# Patient Record
Sex: Female | Born: 1940 | Hispanic: No | Smoking: Never smoker
Health system: Southern US, Community
[De-identification: ages and names within clinical notes are randomized; demographics above are authoritative.]

## PROBLEM LIST (undated history)

## (undated) DIAGNOSIS — E079 Disorder of thyroid, unspecified: Secondary | ICD-10-CM

## (undated) DIAGNOSIS — K219 Gastro-esophageal reflux disease without esophagitis: Secondary | ICD-10-CM

## (undated) DIAGNOSIS — A048 Other specified bacterial intestinal infections: Secondary | ICD-10-CM

## (undated) DIAGNOSIS — E039 Hypothyroidism, unspecified: Secondary | ICD-10-CM

## (undated) HISTORY — PX: HEMORROIDECTOMY: SUR656

## (undated) HISTORY — DX: Gastro-esophageal reflux disease without esophagitis: K21.9

## (undated) HISTORY — PX: CHOLECYSTECTOMY: SHX55

## (undated) HISTORY — DX: Disorder of thyroid, unspecified: E07.9

## (undated) HISTORY — DX: Other specified bacterial intestinal infections: A04.8

---

## 2018-02-26 ENCOUNTER — Ambulatory Visit: Payer: Self-pay | Attending: Internal Medicine | Admitting: Internal Medicine

## 2018-02-26 ENCOUNTER — Encounter: Payer: Self-pay | Admitting: Internal Medicine

## 2018-02-26 VITALS — BP 152/82 | HR 69 | Temp 98.8°F | Resp 16 | Ht 60.5 in | Wt 147.0 lb

## 2018-02-26 DIAGNOSIS — R1013 Epigastric pain: Secondary | ICD-10-CM

## 2018-02-26 DIAGNOSIS — R030 Elevated blood-pressure reading, without diagnosis of hypertension: Secondary | ICD-10-CM

## 2018-02-26 DIAGNOSIS — K8050 Calculus of bile duct without cholangitis or cholecystitis without obstruction: Secondary | ICD-10-CM

## 2018-02-26 DIAGNOSIS — K8070 Calculus of gallbladder and bile duct without cholecystitis without obstruction: Secondary | ICD-10-CM | POA: Insufficient documentation

## 2018-02-26 DIAGNOSIS — E039 Hypothyroidism, unspecified: Secondary | ICD-10-CM

## 2018-02-26 DIAGNOSIS — Z0000 Encounter for general adult medical examination without abnormal findings: Secondary | ICD-10-CM | POA: Insufficient documentation

## 2018-02-26 DIAGNOSIS — D7589 Other specified diseases of blood and blood-forming organs: Secondary | ICD-10-CM

## 2018-02-26 MED ORDER — LEVOTHYROXINE SODIUM 75 MCG PO TABS: 75.0000 ug | ORAL_TABLET | Freq: Every day | ORAL | 3 refills | Status: AC

## 2018-02-26 MED FILL — LEVOTHYROXINE 75 MCG TABLET: 75 | 30 days supply | Qty: 30 | Fill #0

## 2018-02-26 NOTE — Progress Notes (Signed)
Patient ID: Diana Miller, female    DOB: January 14, 1941  MRN: [id number]  CC: No chief complaint on file.   Subjective: Diana Miller is a 77 y.o. female who presents for new pt visit.  Sons, Yaser and [place], are with her.  The son, Lazarus Gowda, interprets.  pt from [place].  Patient came to the [place] a little over a month ago to see her husband who was ailing and has subsequently died. Her concerns today include:   Pt with hx of hypothyroid, H.pylori gastritis and gall stones.  She is on Synthroid 75 mcg daily.  Before leaving [place] 2 to 3 months ago she was having problems with recurrent left and right upper quadrant abdominal pain with meals especially certain types of foods like fatty foods and cheese.  She was found to be H. pylori positive and diagnosed with biliary colic.  Ultrasound reportedly showed several gallstones in the gallbladder.  She was placed on therapy for H. pylori but only took it for 2 days before departing for the [place].  She has several medicines with her 1 of which is a PPI and the other is Cipro.  Today her main complaint is intermittent pain in RUQ of the abdomen with vomiting.  Pain occurs 1 to 2 hours after a meal.  Again it is worse with the types of food mentioned above.  She also gets pain in the epigastric area when she drinks Pepsi.  She is moving her bowels okay.  Denies any blood in the stools.  No problems swallowing.  She eats 2-3 meals a day.  Denies any significant weight changes.   No current outpatient medications on file prior to visit.   No current facility-administered medications on file prior to visit.     No Known Allergies  Social History   Socioeconomic History  . Marital status: Married    Spouse name: Not on file  . Number of children: Not on file  . Years of education: Not on file  . Highest education level: Not on file  Occupational History  . Not on file  Social Needs  . Financial resource strain: Not on file  . Food  insecurity:    Worry: Not on file    Inability: Not on file  . Transportation needs:    Medical: Not on file    Non-medical: Not on file  Tobacco Use  . Smoking status: Not on file  Substance and Sexual Activity  . Alcohol use: Not on file  . Drug use: Not on file  . Sexual activity: Not on file  Lifestyle  . Physical activity:    Days per week: Not on file    Minutes per session: Not on file  . Stress: Not on file  Relationships  . Social connections:    Talks on phone: Not on file    Gets together: Not on file    Attends religious service: Not on file    Active member of club or organization: Not on file    Attends meetings of clubs or organizations: Not on file    Relationship status: Not on file  . Intimate partner violence:    Fear of current or ex partner: Not on file    Emotionally abused: Not on file    Physically abused: Not on file    Forced sexual activity: Not on file  Other Topics Concern  . Not on file  Social History Narrative  . Not on file  ROS: Review of Systems As stated above. PHYSICAL EXAM: BP (!) 152/82   Pulse 69   Temp 98.8 F (37.1 C) (Oral)   Resp 16   Ht 5' 0.5" (1.537 m)   Wt 147 lb (66.7 kg)   SpO2 99%   BMI 28.24 kg/m   150/80 Physical Exam  General appearance - alert, well appearing, elderly female and in no distress Mental status -patient answers questions appropriately. Eyes - pupils equal and reactive, extraocular eye movements intact Mouth - mucous membranes moist, pharynx normal without lesions Neck - supple, no significant adenopathy Chest - clear to auscultation, no wheezes, rales or rhonchi, symmetric air entry Heart - normal rate, regular rhythm, normal S1, S2, no murmurs, rubs, clicks or gallops Abdomen -normal bowel sounds.  Soft, mild right upper quadrant tenderness.  Positive Murphy's sign.  No organomegaly. Extremities -no lower extremity edema.  ASSESSMENT AND PLAN: 1. Biliary colic -Advised patient to  apply for the orange card/cone discount card.  Once approved we can refer to a general surgeon.  Will get gallbladder ultrasound to confirm gallstones. - Comprehensive metabolic panel - US Abdomen Limited RUQ; Future  2. Dyspepsia Avoid NSAIDs.  GERD precautions given.  She can continue to take the PPI that she currently has - CBC - Comprehensive metabolic panel - H. pylori breath test  3. Acquired hypothyroidism - TSH - levothyroxine (SYNTHROID, LEVOTHROID) 75 MCG tablet; Take 1 tablet (75 mcg total) by mouth daily.  Dispense: 90 tablet; Refill: 3  4. Elevated blood pressure reading without diagnosis of hypertension No past history of elevated blood pressure.  DASH diet advised.  We will recheck blood pressure on follow-up visit.  Patient was given the opportunity to ask questions.  Patient verbalized understanding of the plan and was able to repeat key elements of the plan.   Orders Placed This Encounter  Procedures  . US Abdomen Limited RUQ  . CBC  . Comprehensive metabolic panel  . TSH  . H. pylori breath test     Requested Prescriptions   Signed Prescriptions Disp Refills  . levothyroxine (SYNTHROID, LEVOTHROID) 75 MCG tablet 90 tablet 3    Sig: Take 1 tablet (75 mcg total) by mouth daily.    Return in about 1 month (around 03/29/2018).  Jonah Blueeborah Garon Melander, MD, FACP

## 2018-02-27 LAB — CBC
HEMOGLOBIN: 12.5 g/dL (ref 11.1–15.9)
Hematocrit: 38 % (ref 34.0–46.6)
MCH: 32.8 pg (ref 26.6–33.0)
MCHC: 32.9 g/dL (ref 31.5–35.7)
MCV: 100 fL — AB (ref 79–97)
Platelets: 226 10*3/uL (ref 150–450)
RBC: 3.81 x10E6/uL (ref 3.77–5.28)
RDW: 14.1 % (ref 12.3–15.4)
WBC: 4.4 10*3/uL (ref 3.4–10.8)

## 2018-02-27 LAB — COMPREHENSIVE METABOLIC PANEL
A/G RATIO: 1.4 (ref 1.2–2.2)
ALBUMIN: 4.2 g/dL (ref 3.5–4.8)
ALT: 11 IU/L (ref 0–32)
AST: 18 IU/L (ref 0–40)
Alkaline Phosphatase: 78 IU/L (ref 39–117)
BUN / CREAT RATIO: 17 (ref 12–28)
BUN: 11 mg/dL (ref 8–27)
Bilirubin Total: 0.3 mg/dL (ref 0.0–1.2)
CALCIUM: 9.2 mg/dL (ref 8.7–10.3)
CO2: 24 mmol/L (ref 20–29)
Chloride: 102 mmol/L (ref 96–106)
Creatinine, Ser: 0.64 mg/dL (ref 0.57–1.00)
GFR, EST AFRICAN AMERICAN: 99 mL/min/{1.73_m2} (ref >59)
GFR, EST NON AFRICAN AMERICAN: 86 mL/min/{1.73_m2} (ref >59)
Globulin, Total: 3 g/dL (ref 1.5–4.5)
Glucose: 124 mg/dL — ABNORMAL HIGH (ref 65–99)
POTASSIUM: 4 mmol/L (ref 3.5–5.2)
Sodium: 141 mmol/L (ref 134–144)
TOTAL PROTEIN: 7.2 g/dL (ref 6.0–8.5)

## 2018-02-27 LAB — TSH: TSH: 0.875 u[IU]/mL (ref 0.450–4.500)

## 2018-02-27 NOTE — Progress Notes (Signed)
Addendum:  Pt with mild macrocytosis. Will add Vit B 12 and folate level.

## 2018-02-27 NOTE — Addendum Note (Signed)
Addended by: Jonah BlueJOHNSON, DEBORAH B on: 02/27/2018 11:54 AM   Modules accepted: Orders

## 2018-02-28 LAB — H. PYLORI BREATH TEST: H pylori Breath Test: POSITIVE — AB

## 2018-03-01 LAB — SPECIMEN STATUS REPORT

## 2018-03-01 LAB — VITAMIN B12: VITAMIN B 12: 437 pg/mL (ref 232–1245)

## 2018-03-01 LAB — FOLATE

## 2018-03-02 ENCOUNTER — Other Ambulatory Visit: Payer: Self-pay | Admitting: Internal Medicine

## 2018-03-02 MED ORDER — OMEPRAZOLE 20 MG PO CPDR: 20.0000 mg | DELAYED_RELEASE_CAPSULE | Freq: Two times a day (BID) | ORAL | 0 refills | Status: DC

## 2018-03-02 MED ORDER — AMOXICILLIN 500 MG PO TABS: 1000.0000 mg | ORAL_TABLET | Freq: Two times a day (BID) | ORAL | 0 refills | Status: DC

## 2018-03-02 MED ORDER — CLARITHROMYCIN 500 MG PO TABS: 500.0000 mg | ORAL_TABLET | Freq: Two times a day (BID) | ORAL | 0 refills | Status: DC

## 2018-03-04 ENCOUNTER — Telehealth: Payer: Self-pay

## 2018-03-04 ENCOUNTER — Ambulatory Visit: Payer: Self-pay | Attending: Internal Medicine

## 2018-03-04 MED FILL — OMEPRAZOLE 20 MG CAP: 20 | 10 days supply | Qty: 20 | Fill #0

## 2018-03-04 MED FILL — CLARITHROMYCIN 500 MG TAB: 500 | 10 days supply | Qty: 20 | Fill #0

## 2018-03-04 MED FILL — AMOXICILLIN 500 MG CAPSULE: 500 | 10 days supply | Qty: 40 | Fill #0

## 2018-03-04 NOTE — Telephone Encounter (Signed)
[employer] Shireen Id# [phone]  contacted pt to go over lab results contacted pt to go over lab results pt was not home spoke with pt son who came to the appointment with her Lazarus GowdaYaser went over results and pt son doesn't have any questions or concerns

## 2018-03-06 ENCOUNTER — Ambulatory Visit: Payer: Self-pay

## 2018-03-12 ENCOUNTER — Ambulatory Visit (HOSPITAL_COMMUNITY)
Admission: RE | Admit: 2018-03-12 | Discharge: 2018-03-12 | Disposition: A | Discharge status: Home / Self Care | Payer: Self-pay | Source: Ambulatory Visit | Attending: Internal Medicine | Admitting: Internal Medicine

## 2018-03-12 DIAGNOSIS — R1011 Right upper quadrant pain: Secondary | ICD-10-CM | POA: Insufficient documentation

## 2018-03-12 DIAGNOSIS — K8050 Calculus of bile duct without cholangitis or cholecystitis without obstruction: Secondary | ICD-10-CM

## 2018-03-12 DIAGNOSIS — K8020 Calculus of gallbladder without cholecystitis without obstruction: Secondary | ICD-10-CM | POA: Insufficient documentation

## 2018-03-13 ENCOUNTER — Telehealth: Payer: Self-pay

## 2018-03-13 NOTE — Telephone Encounter (Signed)
Call placed to the patient with the assistance of Arabic interpreter # [phone] with [facility] [employer].   As per Dr Laural BenesJohnson:  Let son know that US confirms multiple gall stones. Let me know once she is approved for OC so that we can refer to surgeon.   This CM spoke to son, Lazarus GowdaYaser, and informed him of above note.  He stated that he has applied for [employer] Assistance, so that is in progress. He said that regarding the [employer] Card, he was instructed to bring his mother back to the clinic to apply because she was not with him when he came to make the initial application. He said that he will let us know when she receives the [employer] Card. He also explained that his mother is planning to leave the country to see her mother [place] who is quite ill. He did not have a time frame of when she would be leaving. Informed him that this information would be shared with DrJohnson and the Artistinancial Counselor.

## 2018-03-15 ENCOUNTER — Telehealth: Payer: Self-pay

## 2018-03-15 DIAGNOSIS — K8050 Calculus of bile duct without cholangitis or cholecystitis without obstruction: Secondary | ICD-10-CM

## 2018-03-15 NOTE — Telephone Encounter (Signed)
Patient stopped by to inform pcp that they have received the cafa letter for referral. Please fu regarding surgery at your earliest convenience.

## 2018-03-21 ENCOUNTER — Encounter: Payer: Self-pay | Admitting: Surgery

## 2018-03-21 ENCOUNTER — Telehealth: Payer: Self-pay | Admitting: Internal Medicine

## 2018-03-21 ENCOUNTER — Ambulatory Visit (INDEPENDENT_AMBULATORY_CARE_PROVIDER_SITE_OTHER): Payer: Self-pay | Admitting: Surgery

## 2018-03-21 VITALS — BP 142/85 | HR 76 | Temp 98.0°F | Ht 60.5 in | Wt 145.0 lb

## 2018-03-21 DIAGNOSIS — K8050 Calculus of bile duct without cholangitis or cholecystitis without obstruction: Secondary | ICD-10-CM

## 2018-03-21 NOTE — Progress Notes (Signed)
Diana Miller is an 77 y.o. female.   Chief Complaint: Merri RayGall stones HPI: This patient with gallstones referred for biliary colic.  The referring doctor is Dr. Laural BenesJohnson  She speaks Arabic and is from the [place].  No interpreter was available but her son is present and tells the entire story and history.  Complete review of systems not able to be completed due to language.  No past medical history on file.  Past medical history includes H. pylori and thyroid disease. Past surgical history includes C-sections  She has recently moved from the [place].   Family History  Problem Relation Age of Onset  . Diabetes Mother   . Hypertension Mother    Social History:  has no tobacco, alcohol, and drug history on file.  Allergies: No Known Allergies   (Not in a hospital admission)   Review of Systems:   Review of Systems  Unable to perform ROS: Language  Constitutional: Negative.   HENT: Negative.   Eyes: Negative.   Respiratory: Negative.   Cardiovascular: Negative.   Gastrointestinal: Positive for abdominal pain, nausea and vomiting. Negative for constipation and diarrhea.  Skin: Negative.     Physical Exam:  Physical Exam  Constitutional: She is oriented to person, place, and time. She appears well-developed and well-nourished. No distress.  HENT:  Head: Normocephalic and atraumatic.  Eyes: Pupils are equal, round, and reactive to light. EOM are normal. Right eye exhibits no discharge. Left eye exhibits no discharge. No scleral icterus.  Neck: Normal range of motion. Neck supple.  Cardiovascular: Normal rate, regular rhythm and normal heart sounds.  Pulmonary/Chest: Effort normal and breath sounds normal. No stridor. No respiratory distress.  Abdominal: Soft.  Musculoskeletal: Normal range of motion. She exhibits no edema or deformity.  Neurological: She is alert and oriented to person, place, and time.  Skin: Skin is warm and dry. No rash noted. She is not diaphoretic.  No erythema. No pallor.  Psychiatric: She has a normal mood and affect. Judgment normal.  Vitals reviewed.   There were no vitals taken for this visit.    No results found for this or any previous visit (from the past 48 hour(s)). No results found.   Assessment/Plan Patient has gallstones on ultrasound with normal liver function tests.  This patient with fairly classic story for biliary colic.  No sign of acute cholecystitis.  Recommend laparoscopic cholecystectomy.  Her function tests are normal therefore cholangiography is not indicated.  Risk of bleeding infection recurrence of symptoms failure to resolve her symptoms and conversion to an open procedure with the potential for bile duct injury or bowel injury were all reviewed with them they understood and agreed to proceed.  Lattie Hawichard E Malori Myers, MD, FACS

## 2018-03-21 NOTE — Patient Instructions (Addendum)
You have requested to have your Gallbladder removed. We will arrange this to be done on  03/26/2018 at [facility].   You will be off from work for approximately 1-2 weeks depending on your recovery.   Please avoid greasy and fried foods if at all possible prior to your scheduled surgery to decrease symptoms until then.  Please see the [facility] pre-care form you have been given today.  If you have any questions or concerns please call our office.     Laparoscopic Cholecystectomy, Care After This sheet gives you information about how to care for yourself after your procedure. Your doctor may also give you more specific instructions. If you have problems or questions, contact your doctor. Follow these instructions at home: Care for cuts from surgery (incisions)   Follow instructions from your doctor about how to take care of your cuts from surgery. Make sure you: ? Wash your hands with soap and water before you change your bandage (dressing). If you cannot use soap and water, use hand sanitizer. ? Change your bandage as told by your doctor. ? Leave stitches (sutures), skin glue, or skin tape (adhesive) strips in place. They may need to stay in place for 2 weeks or longer. If tape strips get loose and curl up, you may trim the loose edges. Do not remove tape strips completely unless your doctor says it is okay.  Do not take baths, swim, or use a hot tub until your doctor says it is okay. Ask your doctor if you can take showers. You may only be allowed to take sponge baths for bathing.  Check your surgical cut area every day for signs of infection. Check for: ? More redness, swelling, or pain. ? More fluid or blood. ? Warmth. ? Pus or a bad smell. Activity  Do not drive or use heavy machinery while taking prescription pain medicine.  Do not lift anything that is heavier than 10 lb (4.5 kg) until your doctor says it is okay.  Do not play contact sports until your doctor says it is  okay.  Do not drive for 24 hours if you were given a medicine to help you relax (sedative).  Rest as needed. Do not return to work or school until your doctor says it is okay. General instructions  Take over-the-counter and prescription medicines only as told by your doctor.  To prevent or treat constipation while you are taking prescription pain medicine, your doctor may recommend that you: ? Drink enough fluid to keep your pee (urine) clear or pale yellow. ? Take over-the-counter or prescription medicines. ? Eat foods that are high in fiber, such as fresh fruits and vegetables, whole grains, and beans. ? Limit foods that are high in fat and processed sugars, such as fried and sweet foods. Contact a doctor if:  You develop a rash.  You have more redness, swelling, or pain around your surgical cuts.  You have more fluid or blood coming from your surgical cuts.  Your surgical cuts feel warm to the touch.  You have pus or a bad smell coming from your surgical cuts.  You have a fever.  One or more of your surgical cuts breaks open. Get help right away if:  You have trouble breathing.  You have chest pain.  You have pain that is getting worse in your shoulders.  You faint or feel dizzy when you stand.  You have very bad pain in your belly (abdomen).  You are sick to  your stomach (nauseous) for more than one day.  You have throwing up (vomiting) that lasts for more than one day.  You have leg pain. This information is not intended to replace advice given to you by your health care provider. Make sure you discuss any questions you have with your health care provider. Document Released: 05/09/2008 Document Revised: 02/19/2016 Document Reviewed: 01/17/2016 Elsevier Interactive Patient Education  2018 [employer].

## 2018-03-21 NOTE — Telephone Encounter (Signed)
Patient son came to the facility stating that patient will be leaving the country and would like a six month supply for levothyroxine (SYNTHROID, LEVOTHROID) 75 MCG tablet Pt. Uses [facility] pharmacy. Please f/u

## 2018-03-25 ENCOUNTER — Telehealth: Payer: Self-pay | Admitting: Surgery

## 2018-03-25 NOTE — Telephone Encounter (Signed)
Pt advised of pre op date/time and sx date. Sx: 03/27/18 with Dr Ludwig Clarksooper-laparoscopic cholecystectomy.  Pre op: 03/26/18 @ 12:15pm--office interview. Interpreter requested.   Patient made aware to call [phone], between 1-3:00pm the day before surgery, to find out what time to arrive.

## 2018-03-26 ENCOUNTER — Other Ambulatory Visit: Payer: Self-pay

## 2018-03-26 ENCOUNTER — Encounter
Admission: RE | Admit: 2018-03-26 | Discharge: 2018-03-26 | Disposition: A | Discharge status: Home / Self Care | Payer: Self-pay | Source: Ambulatory Visit | Attending: Surgery | Admitting: Surgery

## 2018-03-26 DIAGNOSIS — Z01818 Encounter for other preprocedural examination: Secondary | ICD-10-CM | POA: Insufficient documentation

## 2018-03-26 DIAGNOSIS — Z01812 Encounter for preprocedural laboratory examination: Secondary | ICD-10-CM | POA: Insufficient documentation

## 2018-03-26 DIAGNOSIS — I491 Atrial premature depolarization: Secondary | ICD-10-CM | POA: Insufficient documentation

## 2018-03-26 HISTORY — DX: Hypothyroidism, unspecified: E03.9

## 2018-03-26 LAB — COMPREHENSIVE METABOLIC PANEL
ALT: 14 U/L (ref 0–44)
AST: 24 U/L (ref 15–41)
Albumin: 4.3 g/dL (ref 3.5–5.0)
Alkaline Phosphatase: 76 U/L (ref 38–126)
Anion gap: 8 (ref 5–15)
BUN: 13 mg/dL (ref 8–23)
CHLORIDE: 106 mmol/L (ref 98–111)
CO2: 27 mmol/L (ref 22–32)
Calcium: 9.4 mg/dL (ref 8.9–10.3)
Creatinine, Ser: 0.63 mg/dL (ref 0.44–1.00)
GFR calc non Af Amer: >60 mL/min (ref >60)
Glucose, Bld: 97 mg/dL (ref 70–99)
Potassium: 3.9 mmol/L (ref 3.5–5.1)
SODIUM: 141 mmol/L (ref 135–145)
Total Bilirubin: 0.8 mg/dL (ref 0.3–1.2)
Total Protein: 7.6 g/dL (ref 6.5–8.1)

## 2018-03-26 LAB — CBC WITH DIFFERENTIAL/PLATELET
BASOS ABS: 0 10*3/uL (ref 0–0.1)
Basophils Relative: 1 %
EOS ABS: 0.1 10*3/uL (ref 0–0.7)
Eosinophils Relative: 3 %
HCT: 36.9 % (ref 35.0–47.0)
Hemoglobin: 12.8 g/dL (ref 12.0–16.0)
Lymphocytes Relative: 48 %
Lymphs Abs: 2.2 10*3/uL (ref 1.0–3.6)
MCH: 33.9 pg (ref 26.0–34.0)
MCHC: 34.7 g/dL (ref 32.0–36.0)
MCV: 97.7 fL (ref 80.0–100.0)
Monocytes Absolute: 0.6 10*3/uL (ref 0.2–0.9)
Monocytes Relative: 13 %
Neutro Abs: 1.5 10*3/uL (ref 1.4–6.5)
Neutrophils Relative %: 35 %
PLATELETS: 226 10*3/uL (ref 150–440)
RBC: 3.77 MIL/uL — AB (ref 3.80–5.20)
RDW: 14.2 % (ref 11.5–14.5)
WBC: 4.5 10*3/uL (ref 3.6–11.0)

## 2018-03-26 MED ORDER — CEFAZOLIN SODIUM-DEXTROSE 2-4 GM/100ML-% IV SOLN
2.0000 g | INTRAVENOUS | Status: AC
  Administered 2018-03-27: 2 g via INTRAVENOUS

## 2018-03-26 MED FILL — LEVOTHYROXINE 75 MCG TABLET: 75 | 180 days supply | Qty: 180 | Fill #1

## 2018-03-26 NOTE — Patient Instructions (Addendum)
Your procedure is scheduled on: Tomorrow at 8:45 Report to DAY SURGERY DEPARTMENT LOCATED ON 2ND FLOOR MEDICAL MALL ENTRANCE.  [phone]   Remember: Instructions that are not followed completely may result in serious medical risk, up to and including death, or upon the discretion of your surgeon and anesthesiologist your surgery may need to be rescheduled.     _X__ 1. Do not eat food after midnight the night before your procedure.                 No gum chewing or hard candies. You may drink clear liquids up to 2 hours                 before you are scheduled to arrive for your surgery- DO not drink clear                 liquids within 2 hours of the start of your surgery.                 Clear Liquids include:  water, apple juice without pulp, clear carbohydrate                 drink such as Clearfast or Gatorade, Black Coffee or Tea (Do not add                 anything to coffee or tea).  __X__2.  On the morning of surgery brush your teeth with toothpaste and water, you                 may rinse your mouth with mouthwash if you wish.  Do not swallow any              toothpaste of mouthwash.     _X__ 3.  No Alcohol for 24 hours before or after surgery.   _X__ 4.  Do Not Smoke or use e-cigarettes For 24 Hours Prior to Your Surgery.                 Do not use any chewable tobacco products for at least 6 hours prior to                 surgery.  ____  5.  Bring all medications with you on the day of surgery if instructed.   __X__  6.  Notify your doctor if there is any change in your medical condition      (cold, fever, infections).     Do not wear jewelry, make-up, hairpins, clips or nail polish. Do not wear lotions, powders, or perfumes.  Do not shave 48 hours prior to surgery. Men may shave face and neck. Do not bring valuables to the hospital.    [facility] is not responsible for any belongings or valuables.  Contacts, dentures/partials or body piercings may not be worn into  surgery. Bring a case for your contacts, glasses or hearing aids, a denture cup will be supplied. Leave your suitcase in the car. After surgery it may be brought to your room. For patients admitted to the hospital, discharge time is determined by your treatment team.   Patients discharged the day of surgery will not be allowed to drive home.   Please read over the following fact sheets that you were given:   MRSA Information  __X__ Take these medicines the morning of surgery with A SIP OF WATER:    1. levothyroxine (SYNTHROID, LEVOTHROID) 75 MCG tablet  2.   3.  4.  5.  6.  ____ Fleet Enema (as directed)   __X__ Use CHG Soap/SAGE wipes as directed  ____ Use inhalers on the day of surgery  ____ Stop metformin/Janumet/Farxiga 2 days prior to surgery    ____ Take 1/2 of usual insulin dose the night before surgery. No insulin the morning          of surgery.   ____ Stop Blood Thinners Coumadin/Plavix/Xarelto/Pleta/Pradaxa/Eliquis/Effient/Aspirin  on   Or contact your Surgeon, Cardiologist or Medical Doctor regarding  ability to stop your blood thinners  __X__ Stop Anti-inflammatories 7 days before surgery such as Advil, Ibuprofen, Motrin,  BC or Goodies Powder, Naprosyn, Naproxen, Aleve, Aspirin    __X__ Stop all herbal supplements, fish oil or vitamin E until after surgery.    ____ Bring C-Pap to the hospital.

## 2018-03-27 ENCOUNTER — Ambulatory Visit: Payer: Self-pay | Admitting: Anesthesiology

## 2018-03-27 ENCOUNTER — Encounter: Payer: Self-pay | Admitting: *Deleted

## 2018-03-27 ENCOUNTER — Other Ambulatory Visit: Payer: Self-pay

## 2018-03-27 ENCOUNTER — Inpatient Hospital Stay
Admission: RE | Admit: 2018-03-27 | Discharge: 2018-03-29 | DRG: 418 | Disposition: A | Discharge status: Home / Self Care | Payer: Self-pay | Source: Ambulatory Visit | Attending: Surgery | Admitting: Surgery

## 2018-03-27 ENCOUNTER — Encounter
Admission: RE | Disposition: A | Discharge status: Home / Self Care | Payer: Self-pay | Source: Ambulatory Visit | Attending: Surgery

## 2018-03-27 DIAGNOSIS — Y658 Other specified misadventures during surgical and medical care: Secondary | ICD-10-CM | POA: Diagnosis not present

## 2018-03-27 DIAGNOSIS — K5900 Constipation, unspecified: Secondary | ICD-10-CM | POA: Diagnosis present

## 2018-03-27 DIAGNOSIS — K9171 Accidental puncture and laceration of a digestive system organ or structure during a digestive system procedure: Secondary | ICD-10-CM | POA: Diagnosis not present

## 2018-03-27 DIAGNOSIS — K8044 Calculus of bile duct with chronic cholecystitis without obstruction: Principal | ICD-10-CM | POA: Diagnosis present

## 2018-03-27 DIAGNOSIS — X58XXXA Exposure to other specified factors, initial encounter: Secondary | ICD-10-CM | POA: Diagnosis present

## 2018-03-27 DIAGNOSIS — Z4659 Encounter for fitting and adjustment of other gastrointestinal appliance and device: Secondary | ICD-10-CM

## 2018-03-27 DIAGNOSIS — K660 Peritoneal adhesions (postprocedural) (postinfection): Secondary | ICD-10-CM | POA: Diagnosis present

## 2018-03-27 DIAGNOSIS — K8050 Calculus of bile duct without cholangitis or cholecystitis without obstruction: Secondary | ICD-10-CM

## 2018-03-27 HISTORY — PX: CHOLECYSTECTOMY: SHX55

## 2018-03-27 SURGERY — LAPAROSCOPIC CHOLECYSTECTOMY
Anesthesia: General | Wound class: Clean Contaminated

## 2018-03-27 MED ORDER — LIDOCAINE HCL (PF) 2 % IJ SOLN
INTRAMUSCULAR | Status: AC
Start: 2018-03-27 — End: ?
  Filled 2018-03-27: qty 10

## 2018-03-27 MED ORDER — FENTANYL CITRATE (PF) 100 MCG/2ML IJ SOLN
INTRAMUSCULAR | Status: AC
  Administered 2018-03-27: 25 ug via INTRAVENOUS
  Filled 2018-03-27: qty 2

## 2018-03-27 MED ORDER — PROPOFOL 10 MG/ML IV BOLUS
INTRAVENOUS | Status: AC
  Filled 2018-03-27: qty 20

## 2018-03-27 MED ORDER — ONDANSETRON HCL 4 MG/2ML IJ SOLN
INTRAMUSCULAR | Status: DC | PRN
  Administered 2018-03-27: 4 mg via INTRAVENOUS

## 2018-03-27 MED ORDER — FENTANYL CITRATE (PF) 100 MCG/2ML IJ SOLN
25.0000 ug | INTRAMUSCULAR | Status: DC | PRN
  Administered 2018-03-27 (×3): 25 ug via INTRAVENOUS

## 2018-03-27 MED ORDER — ONDANSETRON HCL 4 MG/2ML IJ SOLN: 4.0000 mg | Freq: Four times a day (QID) | INTRAMUSCULAR | Status: DC | PRN

## 2018-03-27 MED ORDER — MEPERIDINE HCL 50 MG/ML IJ SOLN: 6.2500 mg | INTRAMUSCULAR | Status: DC | PRN

## 2018-03-27 MED ORDER — FENTANYL CITRATE (PF) 100 MCG/2ML IJ SOLN
INTRAMUSCULAR | Status: DC | PRN
  Administered 2018-03-27: 25 ug via INTRAVENOUS
  Administered 2018-03-27: 75 ug via INTRAVENOUS
  Administered 2018-03-27: 50 ug via INTRAVENOUS

## 2018-03-27 MED ORDER — FENTANYL CITRATE (PF) 100 MCG/2ML IJ SOLN
INTRAMUSCULAR | Status: AC
  Filled 2018-03-27: qty 2

## 2018-03-27 MED ORDER — SUGAMMADEX SODIUM 200 MG/2ML IV SOLN
INTRAVENOUS | Status: DC | PRN
  Administered 2018-03-27: 135 mg via INTRAVENOUS

## 2018-03-27 MED ORDER — LACTATED RINGERS IV SOLN
INTRAVENOUS | Status: DC
  Administered 2018-03-27: 10:00:00 via INTRAVENOUS

## 2018-03-27 MED ORDER — HYDROMORPHONE HCL 1 MG/ML IJ SOLN
0.5000 mg | INTRAMUSCULAR | Status: DC | PRN
  Administered 2018-03-27 – 2018-03-28 (×3): 0.5 mg via INTRAVENOUS
  Filled 2018-03-27 (×3): qty 0.5

## 2018-03-27 MED ORDER — LIDOCAINE HCL (CARDIAC) PF 100 MG/5ML IV SOSY
PREFILLED_SYRINGE | INTRAVENOUS | Status: DC | PRN
  Administered 2018-03-27: 80 mg via INTRAVENOUS

## 2018-03-27 MED ORDER — CEFAZOLIN SODIUM-DEXTROSE 1-4 GM/50ML-% IV SOLN
1.0000 g | Freq: Four times a day (QID) | INTRAVENOUS | Status: DC
  Administered 2018-03-27 – 2018-03-28 (×3): 1 g via INTRAVENOUS
  Filled 2018-03-27 (×6): qty 50

## 2018-03-27 MED ORDER — BUPIVACAINE-EPINEPHRINE (PF) 0.25% -1:200000 IJ SOLN
INTRAMUSCULAR | Status: AC
  Filled 2018-03-27: qty 30

## 2018-03-27 MED ORDER — BUPIVACAINE-EPINEPHRINE (PF) 0.25% -1:200000 IJ SOLN
INTRAMUSCULAR | Status: DC | PRN
  Administered 2018-03-27: 30 mL

## 2018-03-27 MED ORDER — OXYCODONE HCL 5 MG PO TABS: 5.0000 mg | ORAL_TABLET | Freq: Once | ORAL | Status: DC | PRN

## 2018-03-27 MED ORDER — PROPOFOL 10 MG/ML IV BOLUS
INTRAVENOUS | Status: DC | PRN
  Administered 2018-03-27: 100 mg via INTRAVENOUS

## 2018-03-27 MED ORDER — OXYCODONE HCL 5 MG/5ML PO SOLN: 5.0000 mg | Freq: Once | ORAL | Status: DC | PRN

## 2018-03-27 MED ORDER — HEPARIN SODIUM (PORCINE) 5000 UNIT/ML IJ SOLN
5000.0000 [IU] | Freq: Three times a day (TID) | INTRAMUSCULAR | Status: DC
  Administered 2018-03-27 – 2018-03-29 (×6): 5000 [IU] via SUBCUTANEOUS
  Filled 2018-03-27 (×6): qty 1

## 2018-03-27 MED ORDER — ONDANSETRON HCL 4 MG PO TABS: 4.0000 mg | ORAL_TABLET | Freq: Four times a day (QID) | ORAL | Status: DC | PRN

## 2018-03-27 MED ORDER — SUGAMMADEX SODIUM 200 MG/2ML IV SOLN
INTRAVENOUS | Status: AC
  Filled 2018-03-27: qty 2

## 2018-03-27 MED ORDER — FAMOTIDINE IN NACL 20-0.9 MG/50ML-% IV SOLN
20.0000 mg | Freq: Two times a day (BID) | INTRAVENOUS | Status: DC
  Administered 2018-03-27 – 2018-03-29 (×5): 20 mg via INTRAVENOUS
  Filled 2018-03-27 (×6): qty 50

## 2018-03-27 MED ORDER — ACETAMINOPHEN 10 MG/ML IV SOLN
INTRAVENOUS | Status: AC
  Filled 2018-03-27: qty 100

## 2018-03-27 MED ORDER — HEPARIN SODIUM (PORCINE) 5000 UNIT/ML IJ SOLN
5000.0000 [IU] | Freq: Once | INTRAMUSCULAR | Status: AC
  Administered 2018-03-27: 5000 [IU] via SUBCUTANEOUS

## 2018-03-27 MED ORDER — CHLORHEXIDINE GLUCONATE CLOTH 2 % EX PADS
6.0000 | MEDICATED_PAD | Freq: Once | CUTANEOUS | Status: AC
  Administered 2018-03-27: 6 via TOPICAL

## 2018-03-27 MED ORDER — PROMETHAZINE HCL 25 MG/ML IJ SOLN: 6.2500 mg | INTRAMUSCULAR | Status: DC | PRN

## 2018-03-27 MED ORDER — ROCURONIUM BROMIDE 100 MG/10ML IV SOLN
INTRAVENOUS | Status: DC | PRN
  Administered 2018-03-27: 35 mg via INTRAVENOUS

## 2018-03-27 MED ORDER — ACETAMINOPHEN 10 MG/ML IV SOLN
INTRAVENOUS | Status: DC | PRN
  Administered 2018-03-27: 1000 mg via INTRAVENOUS

## 2018-03-27 MED ORDER — ROCURONIUM BROMIDE 50 MG/5ML IV SOLN
INTRAVENOUS | Status: AC
  Filled 2018-03-27: qty 1

## 2018-03-27 MED ORDER — KCL IN DEXTROSE-NACL 10-5-0.45 MEQ/L-%-% IV SOLN
INTRAVENOUS | Status: DC
  Administered 2018-03-27 – 2018-03-28 (×3): via INTRAVENOUS
  Filled 2018-03-27 (×6): qty 1000

## 2018-03-27 MED ORDER — ONDANSETRON HCL 4 MG/2ML IJ SOLN
INTRAMUSCULAR | Status: AC
  Filled 2018-03-27: qty 2

## 2018-03-27 MED ORDER — CEFAZOLIN SODIUM-DEXTROSE 2-4 GM/100ML-% IV SOLN
INTRAVENOUS | Status: AC
  Filled 2018-03-27: qty 100

## 2018-03-27 MED ORDER — PHENYLEPHRINE HCL 10 MG/ML IJ SOLN
INTRAMUSCULAR | Status: DC | PRN
  Administered 2018-03-27: 50 ug via INTRAVENOUS

## 2018-03-27 MED ORDER — HEPARIN SODIUM (PORCINE) 5000 UNIT/ML IJ SOLN
INTRAMUSCULAR | Status: AC
  Administered 2018-03-27: 5000 [IU] via SUBCUTANEOUS
  Filled 2018-03-27: qty 1

## 2018-03-27 SURGICAL SUPPLY — 46 items
ADHESIVE MASTISOL STRL (MISCELLANEOUS) ×3 IMPLANT
APPLIER CLIP ROT 10 11.4 M/L (STAPLE) ×3
BLADE SURG SZ11 CARB STEEL (BLADE) ×3 IMPLANT
CANISTER SUCT 1200ML W/VALVE (MISCELLANEOUS) ×3 IMPLANT
CATH CHOLANGI 4FR 420404F (CATHETERS) IMPLANT
CHLORAPREP W/TINT 26ML (MISCELLANEOUS) ×3 IMPLANT
CLIP APPLIE ROT 10 11.4 M/L (STAPLE) ×1 IMPLANT
CLOSURE WOUND 1/2 X4 (GAUZE/BANDAGES/DRESSINGS) ×1
CONRAY 60ML FOR OR (MISCELLANEOUS) IMPLANT
DRAPE C-ARM XRAY 36X54 (DRAPES) IMPLANT
ELECT REM PT RETURN 9FT ADLT (ELECTROSURGICAL) ×3
ELECTRODE REM PT RTRN 9FT ADLT (ELECTROSURGICAL) ×1 IMPLANT
GAUZE SPONGE 4X4 12PLY STRL (GAUZE/BANDAGES/DRESSINGS) ×3 IMPLANT
GLOVE BIO SURGEON STRL SZ8 (GLOVE) ×3 IMPLANT
GOWN STRL REUS W/ TWL LRG LVL3 (GOWN DISPOSABLE) ×4 IMPLANT
GOWN STRL REUS W/TWL LRG LVL3 (GOWN DISPOSABLE) ×8
IRRIGATION STRYKERFLOW (MISCELLANEOUS) ×1 IMPLANT
IRRIGATOR STRYKERFLOW (MISCELLANEOUS) ×3
IV CATH ANGIO 12GX3 LT BLUE (NEEDLE) IMPLANT
IV NS 1000ML (IV SOLUTION) ×2
IV NS 1000ML BAXH (IV SOLUTION) ×1 IMPLANT
JACKSON PRATT 10 (INSTRUMENTS) IMPLANT
KIT TURNOVER KIT A (KITS) ×3 IMPLANT
LABEL OR SOLS (LABEL) ×3 IMPLANT
NEEDLE HYPO 22GX1.5 SAFETY (NEEDLE) ×3 IMPLANT
NEEDLE VERESS 14GA 120MM (NEEDLE) ×3 IMPLANT
NS IRRIG 500ML POUR BTL (IV SOLUTION) ×3 IMPLANT
PACK LAP CHOLECYSTECTOMY (MISCELLANEOUS) ×3 IMPLANT
POUCH SPECIMEN RETRIEVAL 10MM (ENDOMECHANICALS) ×3 IMPLANT
SCISSORS METZENBAUM CVD 33 (INSTRUMENTS) ×3 IMPLANT
SLEEVE ENDOPATH XCEL 5M (ENDOMECHANICALS) ×6 IMPLANT
SPONGE GAUZE 2X2 8PLY STER LF (GAUZE/BANDAGES/DRESSINGS) ×4
SPONGE GAUZE 2X2 8PLY STRL LF (GAUZE/BANDAGES/DRESSINGS) ×8 IMPLANT
SPONGE LAP 18X18 RF (DISPOSABLE) ×3 IMPLANT
SPONGE VERSALON 4X4 4PLY (MISCELLANEOUS) IMPLANT
STRIP CLOSURE SKIN 1/2X4 (GAUZE/BANDAGES/DRESSINGS) ×2 IMPLANT
SUT MNCRL 4-0 (SUTURE) ×2
SUT MNCRL 4-0 27XMFL (SUTURE) ×1
SUT SILK 3-0 (SUTURE) ×9 IMPLANT
SUT VIC AB 0 CT1 36 (SUTURE) ×9 IMPLANT
SUT VICRYL 0 AB UR-6 (SUTURE) ×3 IMPLANT
SUTURE MNCRL 4-0 27XMF (SUTURE) ×1 IMPLANT
SYR 20CC LL (SYRINGE) ×3 IMPLANT
TROCAR XCEL NON-BLD 11X100MML (ENDOMECHANICALS) ×3 IMPLANT
TROCAR XCEL NON-BLD 5MMX100MML (ENDOMECHANICALS) ×3 IMPLANT
TUBING INSUFFLATION (TUBING) ×3 IMPLANT

## 2018-03-27 NOTE — Anesthesia Postprocedure Evaluation (Signed)
Anesthesia Post Note  Patient: Agricultural consultantZainab Mukhtar Baca  Procedure(s) Performed: LAPAROSCOPIC CHOLECYSTECTOMY (N/A )  Patient location during evaluation: PACU Anesthesia Type: General Level of consciousness: awake and alert and oriented Pain management: pain level controlled Vital Signs Assessment: post-procedure vital signs reviewed and stable Respiratory status: spontaneous breathing, nonlabored ventilation and respiratory function stable Cardiovascular status: blood pressure returned to baseline and stable Postop Assessment: no signs of nausea or vomiting Anesthetic complications: no     Last Vitals:  Vitals:   03/27/18 1246 03/27/18 1251  BP:    Pulse: 79 79  Resp: 16 19  Temp:    SpO2: 99% 96%    Last Pain:  Vitals:   03/27/18 1251  TempSrc:   PainSc: 3                  Carvell Hoeffner

## 2018-03-27 NOTE — Anesthesia Procedure Notes (Signed)
Procedure Name: Intubation Performed by: Meshell Abdulaziz, CRNA Pre-anesthesia Checklist: Patient identified, Patient being monitored, Timeout performed, Emergency Drugs available and Suction available Patient Re-evaluated:Patient Re-evaluated prior to induction Oxygen Delivery Method: Circle system utilized Preoxygenation: Pre-oxygenation with 100% oxygen Induction Type: IV induction Ventilation: Mask ventilation without difficulty Laryngoscope Size: Mac and 3 Grade View: Grade I Tube type: Oral Tube size: 7.0 mm Number of attempts: 1 Airway Equipment and Method: Stylet Placement Confirmation: ETT inserted through vocal cords under direct vision,  positive ETCO2 and breath sounds checked- equal and bilateral Secured at: 21 cm Tube secured with: Tape Dental Injury: Teeth and Oropharynx as per pre-operative assessment        

## 2018-03-27 NOTE — Anesthesia Preprocedure Evaluation (Signed)
Anesthesia Evaluation  Patient identified by MRN, date of birth, ID band Patient awake    Reviewed: Allergy & Precautions, NPO status , Patient's Chart, lab work & pertinent test results  History of Anesthesia Complications Negative for: history of anesthetic complications  Airway Mallampati: II  TM Distance: >3 FB Neck ROM: Full    Dental  (+) Poor Dentition, Missing   Pulmonary neg pulmonary ROS, neg sleep apnea, neg COPD,    breath sounds clear to auscultation- rhonchi (-) wheezing      Cardiovascular Exercise Tolerance: Good (-) hypertension(-) CAD, (-) Past MI, (-) Cardiac Stents and (-) CABG  Rhythm:Regular Rate:Normal - Systolic murmurs and - Diastolic murmurs    Neuro/Psych negative neurological ROS  negative psych ROS   GI/Hepatic Neg liver ROS, GERD  Medicated and Controlled,  Endo/Other  neg diabetesHypothyroidism   Renal/GU      Musculoskeletal negative musculoskeletal ROS (+)   Abdominal (+) - obese,   Peds  Hematology negative hematology ROS (+)   Anesthesia Other Findings Past Medical History: No date: GERD (gastroesophageal reflux disease) No date: H. pylori infection No date: Hypothyroidism No date: Thyroid disease   Reproductive/Obstetrics                             Anesthesia Physical Anesthesia Plan  ASA: II  Anesthesia Plan: General   Post-op Pain Management:    Induction: Intravenous  PONV Risk Score and Plan: 2 and Ondansetron, Dexamethasone and Midazolam  Airway Management Planned: Oral ETT  Additional Equipment:   Intra-op Plan:   Post-operative Plan: Extubation in OR  Informed Consent: I have reviewed the patients History and Physical, chart, labs and discussed the procedure including the risks, benefits and alternatives for the proposed anesthesia with the patient or authorized representative who has indicated his/her understanding and  acceptance.   Dental advisory given  Plan Discussed with: CRNA and Anesthesiologist  Anesthesia Plan Comments:         Anesthesia Quick Evaluation

## 2018-03-27 NOTE — Op Note (Signed)
Laparoscopic Cholecystectomy  Pre-operative Diagnosis: Biliary colic  Post-operative Diagnosis: Biliary colic, extensive adhesions  Procedure: Laparoscopic cholecystectomy, gastrorrhaphy  Surgeon: Adah Salvageichard E. Excell Seltzerooper, MD FACS  Anesthesia: Gen. with endotracheal tube  Assistant: PA student  Procedure Details  The patient was seen again in the Holding Room. The benefits, complications, treatment options, and expected outcomes were discussed with the patient. The risks of bleeding, infection, recurrence of symptoms, failure to resolve symptoms, bile duct damage, bile duct leak, retained common bile duct stone, bowel injury, any of which could require further surgery and/or ERCP, stent, or papillotomy were reviewed with the patient. The likelihood of improving the patient's symptoms with return to their baseline status is good.  The patient and/or family concurred with the proposed plan, giving informed consent.  The patient was taken to Operating Room, [facility] and the procedure verified as Laparoscopic Cholecystectomy.  A Time Out was held and the above information confirmed.  Prior to the induction of general anesthesia, antibiotic prophylaxis was administered. VTE prophylaxis was in place. General endotracheal anesthesia was then administered and tolerated well. After the induction, the abdomen was prepped with Chloraprep and draped in the sterile fashion. The patient was positioned in the supine position.  Local anesthetic  was injected into the skin near the umbilicus and an incision made. The Veress needle was placed. Pneumoperitoneum was then created with CO2 and tolerated well without any adverse changes in the patient's vital signs. A 5mm Visiport was placed in the periumbilical position and the first structure visualized was the mucosa of what was possibly the stomach.  With these findings the laparoscopic approach was abandoned temporarily.  A small incision was  made on the midline to open and explore the area.  Adhesions were taken down in this area sharply.  The trocar sheath had been left in the stomach and no leakage was encountered.  Through the small incision with the trocar sheath in place figure-of-eight 3 oh silks were placed and the trocar port was removed from the stomach as these were tied.  Again there was no spillage.  Lembert type sutures were then placed over the top of the primary layer to perform a watertight seal.  No leakage was encountered.  A nasogastric tube was then placed by anesthesia.  The 3 cm incision was then closed with figure-of-eight 0 Vicryls around a 5 mm blunt trocar.  The last suture was not tied until the end of the case.  Pneumoperitoneum was obtained through this port site.    Two 5-mm ports were placed in the right upper quadrant and a 12 mm epigastric port was placed all under direct vision. All skin incisions  were infiltrated with a local anesthetic agent before making the incision and placing the trocars.   The camera was placed in all 3 of the other port sites to view back and see that there were extensive adhesions in the infraumbilical area and slightly in the supraumbilical area most of these have been taken down.  There is no sign of other bowel involvement.  The patient was positioned  in reverse Trendelenburg, tilted slightly to the patient's left.  The gallbladder was identified, the fundus grasped and retracted cephalad. Adhesions were lysed bluntly. The infundibulum was grasped and retracted laterally, exposing the peritoneum overlying the triangle of Calot. This was then divided and exposed in a blunt fashion. A critical view of the cystic duct and cystic artery was obtained.  The cystic duct was clearly identified  and bluntly dissected.   The cystic lymphatics were doubly clipped and divided.  The cystic artery was then doubly clipped and divided.  This allowed for good visualization of the cystic duct as it  entered the infundibulum of the gallbladder.  Here it was doubly clipped and divided as well.  The gallbladder was taken from the gallbladder fossa in a retrograde fashion with the electrocautery. The gallbladder was removed and placed in an Endocatch bag. The liver bed was irrigated and inspected. Hemostasis was achieved with the electrocautery. Copious irrigation was utilized and was repeatedly aspirated until clear.  The gallbladder and Endocatch sac were then removed through the epigastric port site.   Inspection of the right upper quadrant was performed. No bleeding, bile duct injury or leak, or bowel injury was noted.  Inspection of the area of the greater curvature of the stomach was performed laparoscopically.  The sutures were intact and there was no sign of leakage.  Of note the repair of the injury was now found in the left upper quadrant on the greater curve suggesting that the stomach had been greatly dilated down to the umbilicus and held in place by the adhesions that had been lysed.  The 0 Vicryl final suture was tied and the umbilical port was removed.  Pneumoperitoneum was released.  The epigastric port site was closed with figure-of-eight 0 Vicryl sutures. 4-0 subcuticular Monocryl was used to close the skin. Steristrips and Mastisol and sterile dressings were  applied.  The patient was then extubated and brought to the recovery room in stable condition. Sponge, lap, and needle counts were correct at closure and at the conclusion of the case.   Findings: Chronic cholecystitis with adhesions  Estimated Blood Loss: Nil         Drains: None         Specimens: Gallbladder           Complications: 5 mm puncture of the greater curvature of the stomach repaired primarily in 2 layers with no spillage. Patient to be admitted for observation with nasogastric tube in place.  Sponge lap needle count was correct.               Zoraida Havrilla E. Excell Seltzerooper, MD, FACS

## 2018-03-27 NOTE — Transfer of Care (Signed)
Immediate Anesthesia Transfer of Care Note  Patient: Diana Miller  Procedure(s) Performed: LAPAROSCOPIC CHOLECYSTECTOMY (N/A )  Patient Location: PACU  Anesthesia Type:General  Level of Consciousness: awake and responds to stimulation  Airway & Oxygen Therapy: Patient Spontanous Breathing and Patient connected to face mask oxygen  Post-op Assessment: Report given to RN and Post -op Vital signs reviewed and stable  Post vital signs: Reviewed and stable  Last Vitals:  Vitals Value Taken Time  BP 153/67 03/27/2018 11:47 AM  Temp    Pulse 95 03/27/2018 11:47 AM  Resp 21 03/27/2018 11:47 AM  SpO2 100 % 03/27/2018 11:47 AM    Last Pain:  Vitals:   03/27/18 0910  TempSrc: Tympanic  PainSc: 0-No pain         Complications: No apparent anesthesia complications

## 2018-03-27 NOTE — Progress Notes (Signed)
Multiple family members are present.  Discussed again though he goals and options.  Questions about risks were minimal and answered completely.  Proceeding the operating room shortly.

## 2018-03-27 NOTE — Progress Notes (Signed)
Preoperative Review   Via interpretor:  Patient is met in the preoperative holding area. The history is reviewed in the chart and with the patient. I personally reviewed the options and rationale as well as the risks of this procedure that have been previously discussed with the patient. All questions asked by the patient and/or family were answered to their satisfaction.  Patient agrees to proceed with this procedure at this time.  Florene Glen M.D. FACS

## 2018-03-27 NOTE — Anesthesia Post-op Follow-up Note (Signed)
Anesthesia QCDR form completed.        

## 2018-03-28 ENCOUNTER — Inpatient Hospital Stay: Payer: Self-pay

## 2018-03-28 LAB — CBC
HEMATOCRIT: 33.9 % — AB (ref 35.0–47.0)
HEMOGLOBIN: 12.1 g/dL (ref 12.0–16.0)
MCH: 34.5 pg — AB (ref 26.0–34.0)
MCHC: 35.8 g/dL (ref 32.0–36.0)
MCV: 96.4 fL (ref 80.0–100.0)
PLATELETS: 211 10*3/uL (ref 150–440)
RBC: 3.51 MIL/uL — AB (ref 3.80–5.20)
RDW: 13.6 % (ref 11.5–14.5)
WBC: 7.4 10*3/uL (ref 3.6–11.0)

## 2018-03-28 LAB — COMPREHENSIVE METABOLIC PANEL
ALT: 28 U/L (ref 0–44)
AST: 45 U/L — AB (ref 15–41)
Albumin: 3.6 g/dL (ref 3.5–5.0)
Alkaline Phosphatase: 68 U/L (ref 38–126)
Anion gap: 6 (ref 5–15)
BUN: 7 mg/dL — AB (ref 8–23)
CALCIUM: 8.4 mg/dL — AB (ref 8.9–10.3)
CHLORIDE: 103 mmol/L (ref 98–111)
CO2: 26 mmol/L (ref 22–32)
CREATININE: 0.59 mg/dL (ref 0.44–1.00)
Glucose, Bld: 131 mg/dL — ABNORMAL HIGH (ref 70–99)
Potassium: 4 mmol/L (ref 3.5–5.1)
Sodium: 135 mmol/L (ref 135–145)
Total Bilirubin: 0.7 mg/dL (ref 0.3–1.2)
Total Protein: 7 g/dL (ref 6.5–8.1)

## 2018-03-28 LAB — SURGICAL PATHOLOGY

## 2018-03-28 MED ORDER — ACETAMINOPHEN 325 MG PO TABS: 650.0000 mg | ORAL_TABLET | Freq: Four times a day (QID) | ORAL | Status: DC | PRN

## 2018-03-28 MED ORDER — LEVOTHYROXINE SODIUM 75 MCG PO TABS
75.0000 ug | ORAL_TABLET | Freq: Every day | ORAL | Status: DC
  Administered 2018-03-29: 75 ug via ORAL
  Filled 2018-03-28 (×2): qty 1

## 2018-03-28 MED ORDER — IBUPROFEN 400 MG PO TABS
600.0000 mg | ORAL_TABLET | Freq: Four times a day (QID) | ORAL | Status: DC | PRN
Start: 2018-03-28 — End: 2018-03-29

## 2018-03-28 NOTE — Progress Notes (Signed)
During shift change, previous RN noticed that patient had not had an xray after NG tube placement and she immediately ordered a stat one view. Impression showed that the NG tube was incorrectly placed and [facility] Karle [facility]Lukens [facility] suggested pulling it out. Patient and son were informed of the need to remove the NG tube and need to reinsert another one. Patient refused to have another NG tube placed after explaining the importance of it. Dr.Cintron was notified of her decision. Patient also vomited small amount of emesis prior to discontinuing the NG tube. Abdomen soft and non-distended. Bowel sounds hypoactive in all four quads. Currently denies pain and nausea. Will continue to monitor patient for any further deviations.

## 2018-03-28 NOTE — Progress Notes (Signed)
1 Day Post-Op  Subjective: Status post laparoscopy and laparoscopic cholecystectomy with intraoperative gastrorrhaphy for gastric perforation.  Is a gastric tube was removed last night without an order from me but was not replaced due to patient's refusal.  Today she has a low-grade temp but a normal white blood cell count and normal LFTs.  Patient has no pain this morning.  Family members present for interpretation.  She does not want to have the nasogastric tube replaced. No nausea or vomiting. Objective: Vital signs in last 24 hours: Temp:  [97.6 F (36.4 C)-100 F (37.8 C)] 100 F (37.8 C) (08/15 0413) Pulse Rate:  [79-100] 96 (08/15 0413) Resp:  [13-22] 16 (08/15 0413) BP: (122-166)/(52-79) 166/76 (08/15 0413) SpO2:  [95 %-100 %] 96 % (08/15 0413)    Intake/Output from previous day: 08/14 0701 - 08/15 0700 In: 835 [I.V.:735; IV Piggyback:100] Out: 1075 [Urine:1075] Intake/Output this shift: Total I/O In: 1500 [I.V.:1500] Out: -   Physical exam:  Low-grade temp vital signs are stable Awake alert Abdomen is soft nondistended nontympanitic and nontender.  Wounds are dressed.  Dressings are dry. Calves are nontender.  Lab Results: CBC  Recent Labs    03/26/18 1330 03/28/18 0559  WBC 4.5 7.4  HGB 12.8 12.1  HCT 36.9 33.9*  PLT 226 211   BMET Recent Labs    03/26/18 1330 03/28/18 0559  NA 141 135  K 3.9 4.0  CL 106 103  CO2 27 26  GLUCOSE 97 131*  BUN 13 7*  CREATININE 0.63 0.59  CALCIUM 9.4 8.4*   PT/INR No results for input(s): LABPROT, INR in the last 72 hours. ABG No results for input(s): PHART, HCO3 in the last 72 hours.  Invalid input(s): PCO2, PO2  Studies/Results: Dg Abd Portable 1v  Result Date: 03/28/2018 CLINICAL DATA:  Check nasogastric catheter placement EXAM: PORTABLE ABDOMEN - 1 VIEW COMPARISON:  None. FINDINGS: Scattered large and small bowel gas is noted. Nasogastric catheter is noted with the tip in the mid esophagus and coiled  upon itself within the stomach. This should be withdrawn and reinserted. IMPRESSION: Nasogastric catheter coiled upon itself within the stomach with the tip in the mid esophagus at the level of the aortic knob. Electronically Signed   By: Alcide CleverMark  Lukens M.D.   On: 03/28/2018 00:26    Anti-infectives: Anti-infectives (From admission, onward)   Start     Dose/Rate Route Frequency Ordered Stop   03/27/18 1700  ceFAZolin (ANCEF) IVPB 1 g/50 mL premix     1 g 100 mL/hr over 30 Minutes Intravenous Every 6 hours 03/27/18 1320     03/27/18 0839  ceFAZolin (ANCEF) 2-4 GM/100ML-% IVPB    Note to Pharmacy:  Agnes Lawrenceobinson, Patricia  : cabinet override      03/27/18 0839 03/27/18 1050   03/27/18 0600  ceFAZolin (ANCEF) IVPB 2g/100 mL premix     2 g 200 mL/hr over 30 Minutes Intravenous On call to O.R. 03/26/18 2129 03/27/18 1120      Assessment/Plan: s/p Procedure(s): LAPAROSCOPIC CHOLECYSTECTOMY   Labs reviewed. We will leave the nasogastric tube out at the patient's request and I will start some clear liquids.  The repair of this 5 mm puncture was performed in 2 layers with embrocating sutures and she should be able to drink some liquids without difficulty.  Probably be able to discharge tomorrow.'s were answered for the family.  She has no pain this morning and is doing well.  Lattie Hawichard E Nolan Lasser, MD, FACS  03/28/2018

## 2018-03-29 MED ORDER — ONDANSETRON HCL 4 MG PO TABS: 4.0000 mg | ORAL_TABLET | Freq: Four times a day (QID) | ORAL | 0 refills | Status: DC | PRN

## 2018-03-29 MED ORDER — DOCUSATE SODIUM 100 MG PO CAPS
100.0000 mg | ORAL_CAPSULE | Freq: Two times a day (BID) | ORAL | Status: DC
  Administered 2018-03-29: 100 mg via ORAL
  Filled 2018-03-29: qty 1

## 2018-03-29 MED ORDER — BISACODYL 10 MG RE SUPP
10.0000 mg | Freq: Once | RECTAL | Status: AC
  Administered 2018-03-29: 10 mg via RECTAL
  Filled 2018-03-29: qty 1

## 2018-03-29 MED ORDER — LEVOTHYROXINE SODIUM 75 MCG PO TABS: 75.0000 ug | ORAL_TABLET | Freq: Every day | ORAL | 0 refills | Status: DC

## 2018-03-29 MED ORDER — BISACODYL 10 MG RE SUPP: 10.0000 mg | Freq: Once | RECTAL | 0 refills | Status: AC

## 2018-03-29 MED ORDER — HYDROCODONE-ACETAMINOPHEN 5-300 MG PO TABS: 1.0000 | ORAL_TABLET | ORAL | 0 refills | Status: DC | PRN

## 2018-03-29 MED ORDER — DOCUSATE SODIUM 100 MG PO CAPS
100.0000 mg | ORAL_CAPSULE | Freq: Two times a day (BID) | ORAL | 0 refills | Status: DC
Start: 2018-03-29 — End: 2018-04-01

## 2018-03-29 MED FILL — ONDANSETRON HCL 4 MG TABLET: 4 | 5 days supply | Qty: 20 | Fill #0

## 2018-03-29 NOTE — Care Management (Signed)
Patient to discharge today home with family.  Patient is listed as self pay.  PCP Laural BenesJOHNSON, DEBORAH B.  Follow up appointments have been made.  Patient to be provided rx for pain medication.  Zofran has been escribed to [facility].

## 2018-03-29 NOTE — Discharge Summary (Signed)
Physician Discharge Summary  Patient ID: Diana MurrayZainab Mukhtar Miller MRN: [id number] DOB/AGE: 79-Mar-1942 79 y.o.  Admit date: 03/27/2018 Discharge date: 03/29/2018   Discharge Diagnoses:  Active Problems:   Biliary colic   Procedures: Laparoscopic cholecystectomy, gastrorrhaphy for gastric puncture  Hospital Course: This patient who was admitted to the hospital following a laparoscopic cholecystectomy.  During that cholecystectomy adhesions had been encountered which resulted in a 5 mm puncture wound to the stomach which was repaired primarily.  In the postoperative period the patient's NG tube was removed and the patient refused to allow it to be replaced.  However she is tolerating a clear liquid diet at this point without difficulty.  I will advance her to full liquids today and asked them to stay on full liquids over the weekend.  I will see them in the office on Monday or Tuesday.  Instructions currently concerning low-grade fever and constipation have been given to the family as well.  Oral analgesics will be prescribed and she will restart all of her home medications including the Synthroid.  She will be given Colace and Dulcolax suppositories as well.  Questions were answered for her and her family member present at the bedside acting as an interpreter.  Consults: None  Disposition: Discharge disposition: 01-Home or Self Care        Allergies as of 03/29/2018      Reactions   Prilosec [omeprazole] Other (See Comments)   weakness      Medication List    TAKE these medications   bisacodyl 10 MG suppository Commonly known as:  DULCOLAX Place 1 suppository (10 mg total) rectally once for 1 dose.   docusate sodium 100 MG capsule Commonly known as:  COLACE Take 1 capsule (100 mg total) by mouth 2 (two) times daily.   HYDROcodone-Acetaminophen 5-300 MG Tabs Take 1 tablet by mouth every 4 (four) hours as needed.   levothyroxine 75 MCG tablet Commonly known as:  SYNTHROID,  LEVOTHROID Take 1 tablet (75 mcg total) by mouth daily. What changed:  Another medication with the same name was added. Make sure you understand how and when to take each.   levothyroxine 75 MCG tablet Commonly known as:  SYNTHROID, LEVOTHROID Take 1 tablet (75 mcg total) by mouth daily at 6 (six) AM. Start taking on:  03/30/2018 What changed:  You were already taking a medication with the same name, and this prescription was added. Make sure you understand how and when to take each.   MULTIVITAMIN PO Take 1 tablet by mouth daily.   omeprazole 20 MG capsule Commonly known as:  PRILOSEC Take 1 capsule (20 mg total) by mouth 2 (two) times daily before a meal.   ondansetron 4 MG tablet Commonly known as:  ZOFRAN Take 1 tablet (4 mg total) by mouth every 6 (six) hours as needed for nausea.      Follow-up Information    Lattie Hawooper, Labella Zahradnik E, MD Follow up in 4 day(s).   Specialty:  General Surgery Contact information: [address] [place] [address] [phone]           Lattie Hawichard E Laxmi Choung, MD, FACS

## 2018-03-29 NOTE — Progress Notes (Signed)
2 Days Post-Op  Subjective: Patient is postop day 2 from a laparoscopic cholecystectomy with gastrorrhaphy for stomach puncture.  Is tolerating clear liquid diet and has no complaints.  Did have a low-grade temperature last night.  That is cleared now and afebrile.  No nausea or vomiting.  Family members present with interpretation.  He does state that before surgery she was having trouble with constipation and would like something for that.  Objective: Vital signs in last 24 hours: Temp:  [98.7 F (37.1 C)-100 F (37.8 C)] 98.7 F (37.1 C) (08/16 0427) Pulse Rate:  [90-97] 91 (08/16 0427) Resp:  [17-20] 18 (08/16 0427) BP: (128-147)/(66-69) 147/67 (08/16 0427) SpO2:  [97 %-99 %] 99 % (08/16 0427)    Intake/Output from previous day: 08/15 0701 - 08/16 0700 In: 2887 [P.O.:120; I.V.:2667; IV Piggyback:100] Out: 900 [Urine:900] Intake/Output this shift: Total I/O In: 191 [I.V.:191] Out: 900 [Urine:900]  Physical exam:  Abdomen is soft nondistended nontympanitic and nontender wounds are clean no erythema no drainage Steri-Strips are in place. Calves are nontender Vital signs reviewed.  Lab Results: CBC  Recent Labs    03/26/18 1330 03/28/18 0559  WBC 4.5 7.4  HGB 12.8 12.1  HCT 36.9 33.9*  PLT 226 211   BMET Recent Labs    03/26/18 1330 03/28/18 0559  NA 141 135  K 3.9 4.0  CL 106 103  CO2 27 26  GLUCOSE 97 131*  BUN 13 7*  CREATININE 0.63 0.59  CALCIUM 9.4 8.4*   PT/INR No results for input(s): LABPROT, INR in the last 72 hours. ABG No results for input(s): PHART, HCO3 in the last 72 hours.  Invalid input(s): PCO2, PO2  Studies/Results: Dg Abd Portable 1v  Result Date: 03/28/2018 CLINICAL DATA:  Check nasogastric catheter placement EXAM: PORTABLE ABDOMEN - 1 VIEW COMPARISON:  None. FINDINGS: Scattered large and small bowel gas is noted. Nasogastric catheter is noted with the tip in the mid esophagus and coiled upon itself within the stomach. This should  be withdrawn and reinserted. IMPRESSION: Nasogastric catheter coiled upon itself within the stomach with the tip in the mid esophagus at the level of the aortic knob. Electronically Signed   By: Alcide CleverMark  Lukens M.D.   On: 03/28/2018 00:26    Anti-infectives: Anti-infectives (From admission, onward)   Start     Dose/Rate Route Frequency Ordered Stop   03/27/18 1700  ceFAZolin (ANCEF) IVPB 1 g/50 mL premix  Status:  Discontinued     1 g 100 mL/hr over 30 Minutes Intravenous Every 6 hours 03/27/18 1320 03/28/18 1011   03/27/18 0839  ceFAZolin (ANCEF) 2-4 GM/100ML-% IVPB    Note to Pharmacy:  Agnes Lawrenceobinson, Patricia  : cabinet override      03/27/18 0839 03/27/18 1050   03/27/18 0600  ceFAZolin (ANCEF) IVPB 2g/100 mL premix     2 g 200 mL/hr over 30 Minutes Intravenous On call to O.R. 03/26/18 2129 03/27/18 1120      Assessment/Plan: s/p Procedure(s): LAPAROSCOPIC CHOLECYSTECTOMY   Patient doing very well Tolerating a clear liquid diet.  Will advance to full liquids and keep her on full liquids over the weekend.  Need for antibiotics at this point.  They were stopped yesterday.   Will place a rectal suppository today to assist with constipation and I discussed the need for that periodically at home.  We will discharge today on oral analgesics.    Lattie Hawichard E Brinton Brandel, MD, FACS  03/29/2018

## 2018-03-29 NOTE — Discharge Instructions (Signed)
Stay on full liquid diet over the weekend. May shower Follow-up with Dr. Excell Seltzerooper on Monday or Tuesday Resume all home medications Take oral Vicodin as needed for pain Rectal suppository for constipation as needed.

## 2018-03-29 NOTE — Progress Notes (Signed)
IV was removed. Discharge instructions, follow-up appointments, and prescriptions were provided to the pt and sons at bedside via telephone interpreter.  All questions answered. The pt was taken downstairs via wheelchair by NT.

## 2018-04-01 ENCOUNTER — Ambulatory Visit (INDEPENDENT_AMBULATORY_CARE_PROVIDER_SITE_OTHER): Payer: Self-pay | Admitting: Surgery

## 2018-04-01 ENCOUNTER — Encounter: Payer: Self-pay | Admitting: Surgery

## 2018-04-01 VITALS — BP 136/74 | HR 75 | Temp 97.5°F | Ht 61.0 in | Wt 144.8 lb

## 2018-04-01 DIAGNOSIS — K8050 Calculus of bile duct without cholangitis or cholecystitis without obstruction: Secondary | ICD-10-CM

## 2018-04-01 NOTE — Patient Instructions (Addendum)
Please call  If you have questions or concerns.    GENERAL POST-OPERATIVE PATIENT INSTRUCTIONS   WOUND CARE INSTRUCTIONS:  Keep a dry clean dressing on the wound if there is drainage. The initial bandage may be removed after 24 hours.  Once the wound has quit draining you may leave it open to air.  If clothing rubs against the wound or causes irritation and the wound is not draining you may cover it with a dry dressing during the daytime.  Try to keep the wound dry and avoid ointments on the wound unless directed to do so.  If the wound becomes bright red and painful or starts to drain infected material that is not clear, please contact your physician immediately.  If the wound is mildly pink and has a thick firm ridge underneath it, this is normal, and is referred to as a healing ridge.  This will resolve over the next 4-6 weeks.  BATHING: You may shower if you have been informed of this by your surgeon. However, Please do not submerge in a tub, hot tub, or pool until incisions are completely sealed or have been told by your surgeon that you may do so.  DIET:  You may eat any foods that you can tolerate.  It is a good idea to eat a high fiber diet and take in plenty of fluids to prevent constipation.  If you do become constipated you may want to take a mild laxative or take ducolax tablets on a daily basis until your bowel habits are regular.  Constipation can be very uncomfortable, along with straining, after recent surgery.  ACTIVITY:  You are encouraged to cough and deep breath or use your incentive spirometer if you were given one, every 15-30 minutes when awake.  This will help prevent respiratory complications and low grade fevers post-operatively if you had a general anesthetic.  You may want to hug a pillow when coughing and sneezing to add additional support to the surgical area, if you had abdominal or chest surgery, which will decrease pain during these times.  You are encouraged to walk and  engage in light activity for the next two weeks.  You should not lift more than 20 pounds, until 05/08/2018 as it could put you at increased risk for complications.  Twenty pounds is roughly equivalent to a plastic bag of groceries. At that time- Listen to your body when lifting, if you have pain when lifting, stop and then try again in a few days. Soreness after doing exercises or activities of daily living is normal as you get back in to your normal routine.  MEDICATIONS:  Try to take narcotic medications and anti-inflammatory medications, such as tylenol, ibuprofen, naprosyn, etc., with food.  This will minimize stomach upset from the medication.  Should you develop nausea and vomiting from the pain medication, or develop a rash, please discontinue the medication and contact your physician.  You should not drive, make important decisions, or operate machinery when taking narcotic pain medication.  SUNBLOCK Use sun block to incision area over the next year if this area will be exposed to sun. This helps decrease scarring and will allow you avoid a permanent darkened area over your incision.  QUESTIONS:  Please feel free to call our office if you have any questions, and we will be glad to assist you. [phone]

## 2018-04-01 NOTE — Progress Notes (Signed)
Outpatient postop visit  04/01/2018  Diana SimpsonZainab Miller Arbutus PedMohamed is an 77 y.o. female.    Procedure: Laparoscopic cholecystectomy with gastrorrhaphy  CC: No problems HPI: This patient status post laparoscopic cholecystectomy for biliary colic.  Intraoperatively she required a gastrorrhaphy for a gastric puncture.  Was from the 5 mm port placement.  Patient is tolerating a regular diet having no problems having good bowel movements no fevers or chills no nausea or vomiting is accompanied by her son.  Medications reviewed.    Physical Exam:  There were no vitals taken for this visit.    PE:  No erythema no drainage wounds are clean soft nontender abdomen nondistended nontympanitic   Assessment/Plan:  Status post laparoscopic cholecystectomy.  Pathology is reviewed.  Confirms diagnosis.  Patient doing very well recommend follow-up on an as-needed basis.  Patient is [place] to the [place] on Saturday.  She appears well enough to travel at this point.  Lattie Hawichard E Dickie Cloe, MD, FACS

## 2018-04-02 ENCOUNTER — Telehealth: Payer: Self-pay

## 2018-04-02 NOTE — Telephone Encounter (Signed)
Patient's son returned call.  States they know who to reach out to about changes in condition and that they just saw her doctor yesterday.  Reports she is doing well.  No questions or concerns at this time. I thanked him for his time and informed him that they would receive one more automated call checking on her in the next few days.

## 2018-04-02 NOTE — Telephone Encounter (Signed)
Flagged on EMMI report for not knowing who to reach out to for changes in condition.  Per chart, patient had an appointment with Dr. Excell Seltzerooper on 04/01/18.  First attempt to reach made 04/02/18 at 1:30pm, however unable to reach.  Left message encouraging callback.  Will attempt at later time.

## 2018-04-03 ENCOUNTER — Ambulatory Visit: Payer: Self-pay | Attending: Family Medicine | Admitting: Physician Assistant

## 2018-04-03 VITALS — BP 117/75 | HR 82 | Temp 98.5°F | Resp 16 | Wt 143.4 lb

## 2018-04-03 DIAGNOSIS — Z9049 Acquired absence of other specified parts of digestive tract: Secondary | ICD-10-CM | POA: Insufficient documentation

## 2018-04-03 DIAGNOSIS — E039 Hypothyroidism, unspecified: Secondary | ICD-10-CM | POA: Insufficient documentation

## 2018-04-03 DIAGNOSIS — K219 Gastro-esophageal reflux disease without esophagitis: Secondary | ICD-10-CM | POA: Insufficient documentation

## 2018-04-03 DIAGNOSIS — Z79890 Hormone replacement therapy (postmenopausal): Secondary | ICD-10-CM | POA: Insufficient documentation

## 2018-04-03 DIAGNOSIS — H04123 Dry eye syndrome of bilateral lacrimal glands: Secondary | ICD-10-CM | POA: Insufficient documentation

## 2018-04-03 DIAGNOSIS — Z79899 Other long term (current) drug therapy: Secondary | ICD-10-CM | POA: Insufficient documentation

## 2018-04-03 DIAGNOSIS — Z09 Encounter for follow-up examination after completed treatment for conditions other than malignant neoplasm: Secondary | ICD-10-CM | POA: Insufficient documentation

## 2018-04-03 DIAGNOSIS — K8050 Calculus of bile duct without cholangitis or cholecystitis without obstruction: Secondary | ICD-10-CM | POA: Insufficient documentation

## 2018-04-03 NOTE — Patient Instructions (Signed)
lacrilube or rewetting eye drops

## 2018-04-03 NOTE — Progress Notes (Signed)
Patient ID: Alison MurrayZainab Mukhtar Harju, female   DOB: [birth year], 77 y.o.   MRN: [id number]    Tia AlertZainab Adelsberger, is a 77 y.o. female  [id number]  [id number]  DOB - [birth year]  Subjective:  Chief Complaint and HPI: Tia AlertZainab Marinello is a 77 y.o. female here today to  for a follow up visit After hospitalization 8/14-8/16/2019 for complications after laparoscopic cholecystectomy.  She denies any pain and has had surgical f/up with Dr Excell Seltzerooper.  Bowels and bladder moving normally.  No fever.  No abdominal pain.  No N/V.    Also c/o dry eyes.  No eye pain.  No vision change.     Family member translating.     From discharge summary: Admit date: 03/27/2018 Discharge date: 03/29/2018   Discharge Diagnoses:  Active Problems:   Biliary colic    Procedures: Laparoscopic cholecystectomy, gastrorrhaphy for gastric puncture  Hospital Course: This patient who was admitted to the hospital following a laparoscopic cholecystectomy.  During that cholecystectomy adhesions had been encountered which resulted in a 5 mm puncture wound to the stomach which was repaired primarily.  In the postoperative period the patient's NG tube was removed and the patient refused to allow it to be replaced.  However she is tolerating a clear liquid diet at this point without difficulty.  I will advance her to full liquids today and asked them to stay on full liquids over the weekend.  I will see them in the office on Monday or Tuesday.  Instructions currently concerning low-grade fever and constipation have been given to the family as well.  Oral analgesics will be prescribed and she will restart all of her home medications including the Synthroid.  She will be given Colace and Dulcolax suppositories as well.  Questions were answered for her and her family member present at the bedside acting as an interpreter.  Saw Dr Excell Seltzerooper on 04/01/2018-from A/P: Status post laparoscopic cholecystectomy.  Pathology is reviewed.   Confirms diagnosis.  Patient doing very well recommend follow-up on an as-needed basis.  Patient is traveling to the [place] on Saturday.  She appears well enough to travel at this point.  ED/Hospital notes reviewed.   Social History: leaving Saturday for [place] for a couple of months to a year.    ROS:   Constitutional:  No f/c, No night sweats, No unexplained weight loss. EENT:  No vision changes, No blurry vision, No hearing changes. No mouth, throat, or ear problems.  Respiratory: No cough, No SOB Cardiac: No CP, no palpitations GI:  No abd pain, No N/V/D. GU: No Urinary s/sx Musculoskeletal: No joint pain Neuro: No headache, no dizziness, no motor weakness.  Skin: No rash Endocrine:  No polydipsia. No polyuria.  Psych: Denies SI/HI  No problems updated.  ALLERGIES: Allergies  Allergen Reactions  . Prilosec [Omeprazole] Other (See Comments)    weakness    PAST MEDICAL HISTORY: Past Medical History:  Diagnosis Date  . GERD (gastroesophageal reflux disease)   . H. pylori infection   . Hypothyroidism   . Thyroid disease     MEDICATIONS AT HOME: Prior to Admission medications   Medication Sig Start Date End Date Taking? Authorizing Provider  levothyroxine (SYNTHROID, LEVOTHROID) 75 MCG tablet Take 1 tablet (75 mcg total) by mouth daily. 02/26/18   Marcine MatarJohnson, Deborah B, MD  Multiple Vitamins-Minerals (MULTIVITAMIN PO) Take 1 tablet by mouth daily.    [provider]     Objective:  EXAM:   Vitals:   04/03/18 1516  BP: 117/75  Pulse: 82  Resp: 16  Temp: 98.5 F (36.9 C)  TempSrc: Oral  SpO2: 98%  Weight: 143 lb 6.4 oz (65 kg)    General appearance : A&OX3. NAD. Non-toxic-appearing HEENT: Atraumatic and Normocephalic.  PERRLA. EOM intact.  Fundi benign.  She does have B cataracts. No erythema of conjunctiva B. Neck: supple, no JVD. No cervical lymphadenopathy. No thyromegaly Chest/Lungs:  Breathing-non-labored, Good air entry bilaterally, breath sounds  normal without rales, rhonchi, or wheezing  CVS: S1 S2 regular, no murmurs, gallops, rubs  Abdomen: incision sites with steri strips-no surrounding erythema or induration.  Bowel sounds present, Non tender and not distended with no gaurding, rigidity or rebound. Extremities: Bilateral Lower Ext shows no edema, both legs are warm to touch with = pulse throughout Neurology:  CN II-XII grossly intact, Non focal.   Psych:  TP linear. J/I WNL. Normal speech. Appropriate eye contact and affect.  Skin:  No Rash  Data Review No results found for: HGBA1C   Assessment & Plan   1. Biliary colic Resolved s/p cholecystectomy and subsequent complications  2. Hospital discharge follow-up Much improved.  Cleared by surgery for travel  3. Dry eyes lacrilube or other re-wetting drops prn.       Patient have been counseled extensively about nutrition and exercise  Return in about 3 months (around 07/04/2018) for for hypothyroid-Dr Laural BenesJohnson.  The patient was given clear instructions to go to ER or return to medical center if symptoms don't improve, worsen or new problems develop. The patient verbalized understanding. The patient was told to call to get lab results if they haven't heard anything in the next week.     Georgian CoAngela Whalen Trompeter, PA-C [facility] [place], [place] [phone]   04/03/2018, 3:32 PM

## 2018-04-04 ENCOUNTER — Telehealth: Payer: Self-pay | Admitting: Licensed Clinical Social Worker

## 2018-04-04 NOTE — Telephone Encounter (Signed)
CSW contacted patient this afternoon in response to an EMMI call response. Patient was not available to come to the phone and the contact for her was her son. Patient's son answered and stated that his mother was doing well and that she had hit the response for being sad by mistake. He talked about having some itching at her surgical site and that they did follow up with the physician regarding this. York SpanielMonica Haliegh Khurana MSW,LCSW [phone]

## 2020-04-12 IMAGING — US US ABDOMEN LIMITED
1 series · 14 of 25 positions shown · non-contrast
Comparison: None.

CLINICAL DATA: 76-year-old female with right upper quadrant pain.
Initial encounter.

EXAM:
ULTRASOUND ABDOMEN LIMITED RIGHT UPPER QUADRANT

[Series 1: us abdomen limited · 0.17mm/px · 14 of 26 slices shown]
[im 1/26]
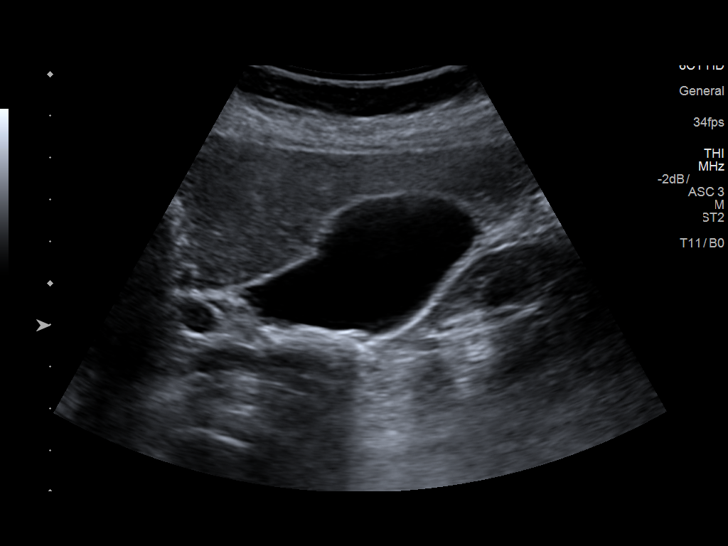
[im 3/26]
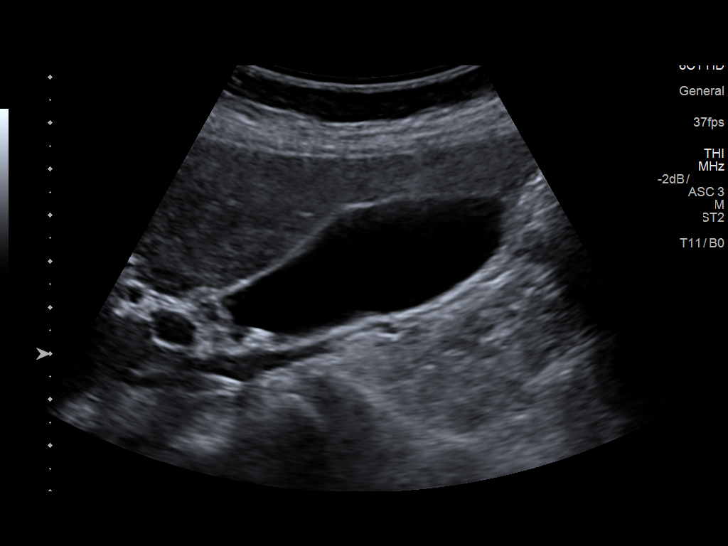
[im 5/26]
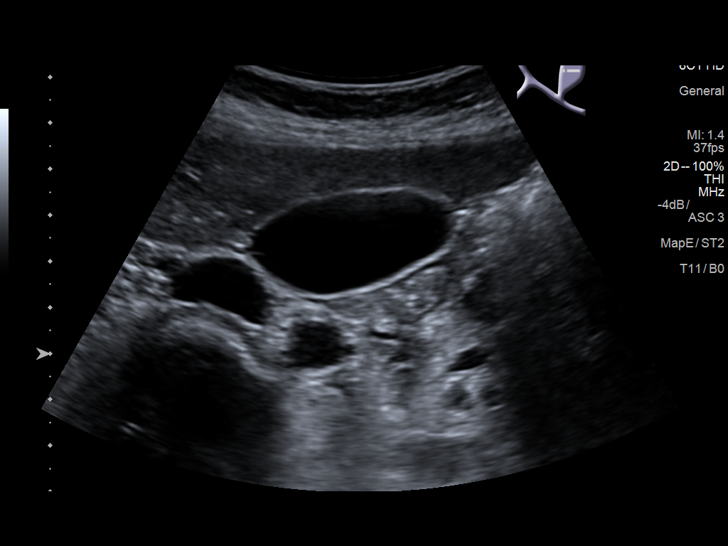
[im 7/26]
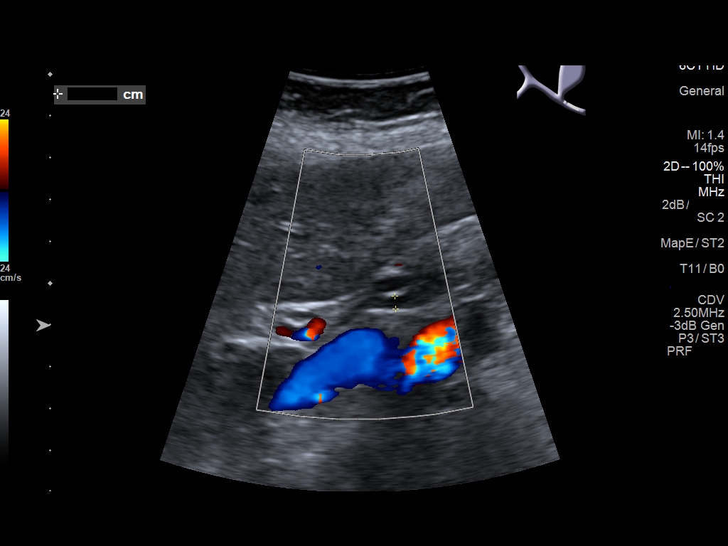
[im 9/26]
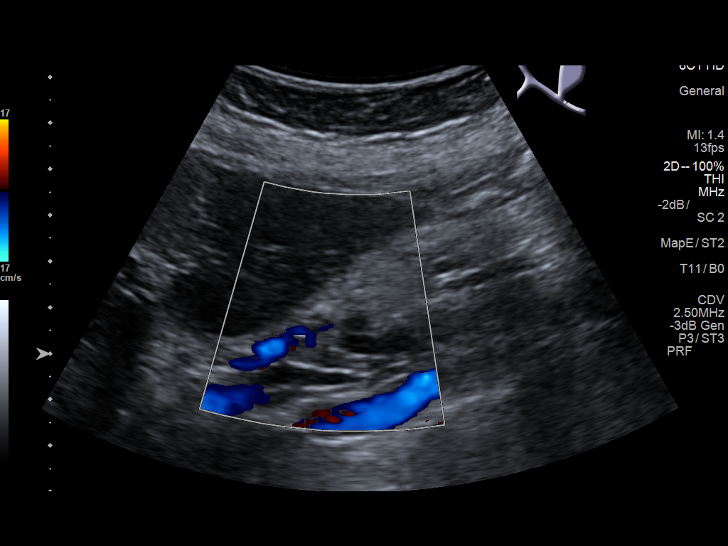
[im 10/26]
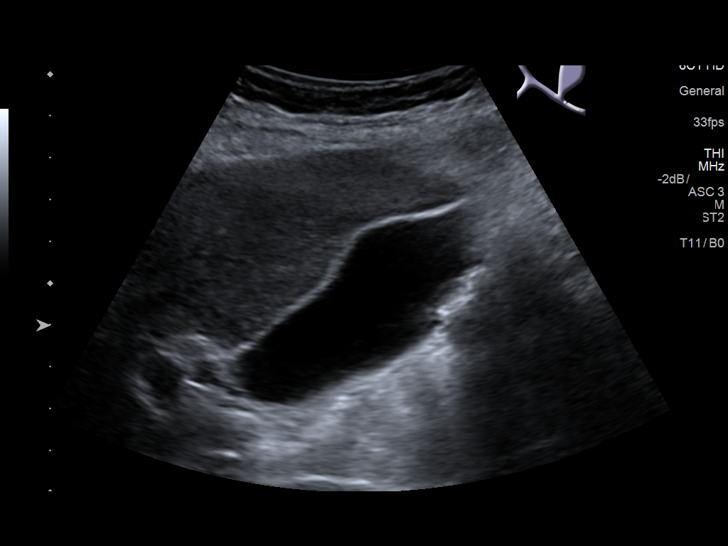
[im 12/26]
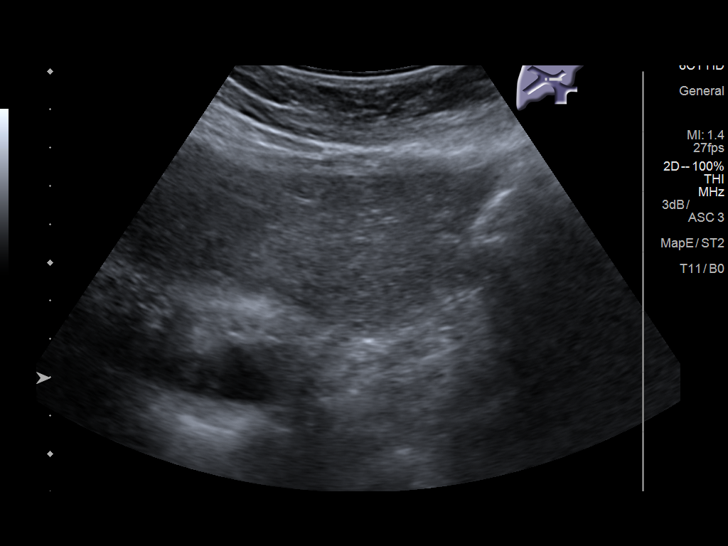
[im 14/26]
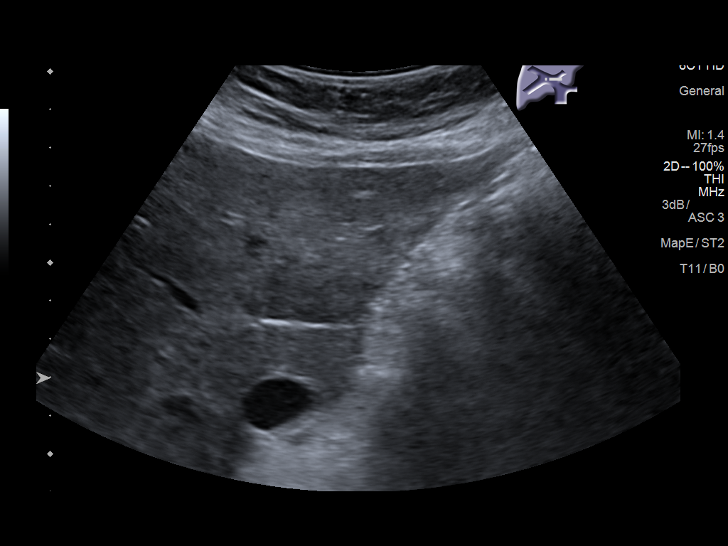
[im 16/26]
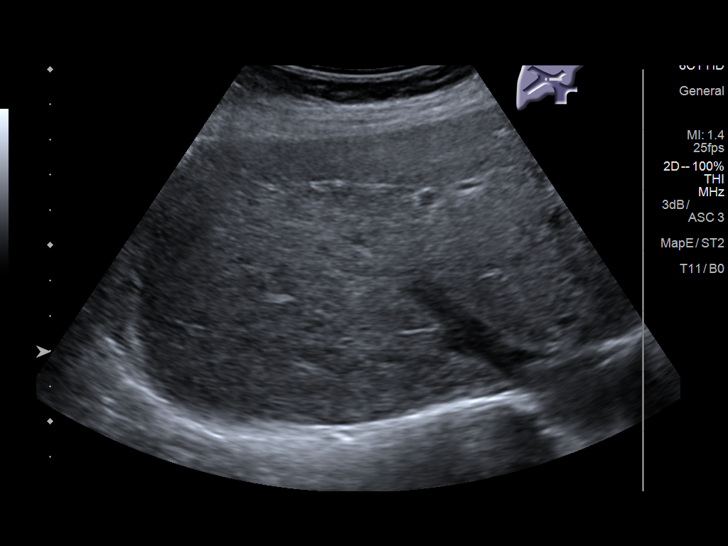
[im 17/26]
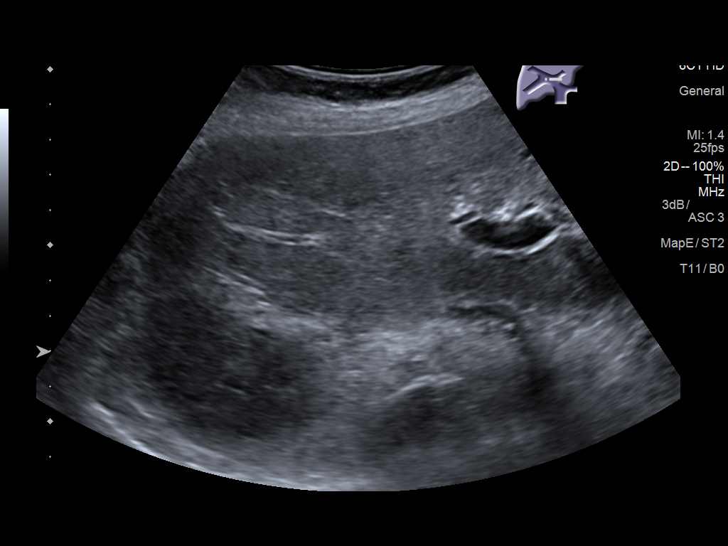
[im 19/26]
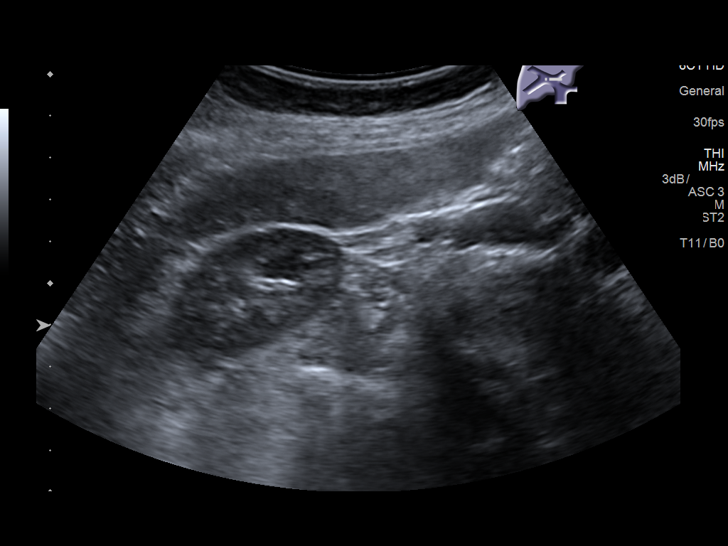
[im 21/26]
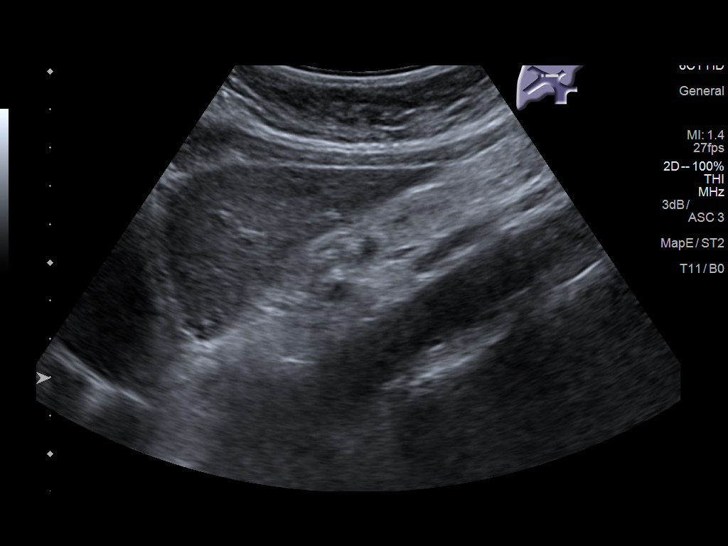
[im 23/26]
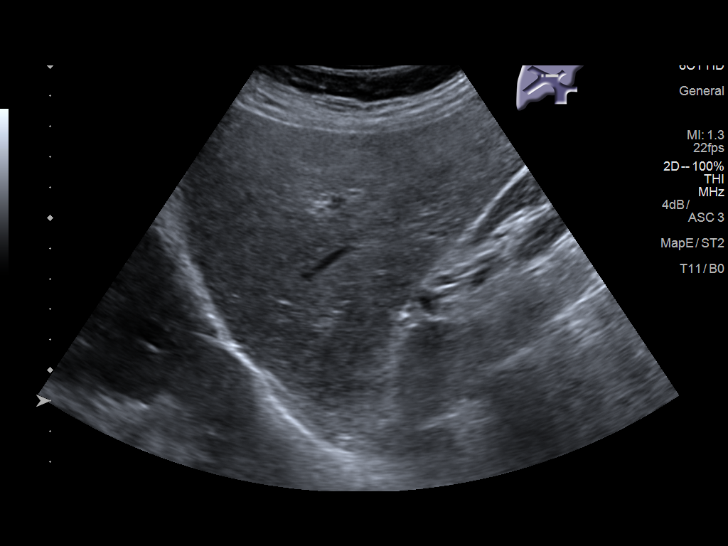
[im 26/26]
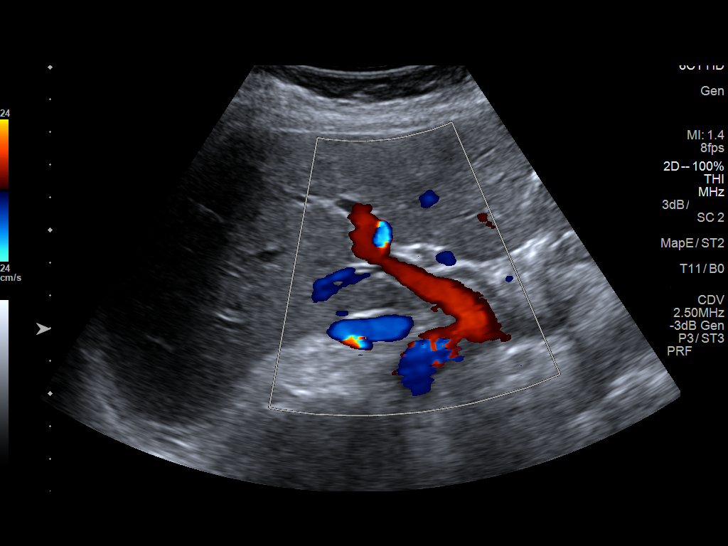

[14 of 25 positions shown; findings below may reference images not displayed]

FINDINGS: Gallbladder:

Multiple mobile gallstones measuring up to 7 mm. No gallbladder wall
thickening or tenderness over the gallbladder during scanning.

Common bile duct:

Diameter: 3 mm

Liver:

No focal lesion identified. Within normal limits in parenchymal
echogenicity. Portal vein is patent on color Doppler imaging with
normal direction of blood flow towards the liver.
IMPRESSION: Multiple mobile gallstones without ultrasound evidence of
gallbladder inflammation. Otherwise negative right upper quadrant
ultrasound.
